# Patient Record
Sex: Male | Born: 2000 | Race: White | Hispanic: No | Marital: Single
Health system: Southern US, Community
[De-identification: ages and names within clinical notes are randomized; demographics above are authoritative.]

---
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
## 2021-05-20 ENCOUNTER — Other Ambulatory Visit: Payer: Self-pay

## 2021-05-20 ENCOUNTER — Ambulatory Visit (INDEPENDENT_AMBULATORY_CARE_PROVIDER_SITE_OTHER): Payer: Self-pay | Admitting: Family Medicine

## 2021-05-20 ENCOUNTER — Encounter (HOSPITAL_BASED_OUTPATIENT_CLINIC_OR_DEPARTMENT_OTHER): Payer: Self-pay | Admitting: Family Medicine

## 2021-05-20 DIAGNOSIS — Z7689 Persons encountering health services in other specified circumstances: Secondary | ICD-10-CM | POA: Insufficient documentation

## 2021-05-20 NOTE — Patient Instructions (Signed)
?  Medication Instructions:  ?Your physician recommends that you continue on your current medications as directed. Please refer to the Current Medication list given to you today. ?--If you need a refill on any your medications before your next appointment, please call your pharmacy first. If no refills are authorized on file call the office.-- ?ange your treatment, we will call you to review the results. ? ? ?Follow-Up: ?Your next appointment:   ?Your physician recommends that you schedule a follow-up appointment in: This week with completed form with Dr. de [place] ? ?You will receive a text message or e-mail with a link to a survey about your care and experience with [place] today! We would greatly appreciate your feedback!  ? ?Thanks for letting us be apart of your health journey!!  ?Primary Care and Sports Medicine  ? ?Dr. Marcy Salvo de [place]  ? ?We encourage you to activate your patient portal called "MyChart".  Sign up information is provided on this After Visit Summary.  MyChart is used to connect with patients for Virtual Visits (Telemedicine).  Patients are able to view lab/test results, encounter notes, upcoming appointments, etc.  Non-urgent messages can be sent to your provider as well. To learn more about what you can do with MyChart, please visit --  [email or web address].   ? ?
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
## 2021-05-21 ENCOUNTER — Ambulatory Visit (INDEPENDENT_AMBULATORY_CARE_PROVIDER_SITE_OTHER): Payer: Self-pay | Admitting: Family Medicine

## 2021-05-21 DIAGNOSIS — Z025 Encounter for examination for participation in sport: Secondary | ICD-10-CM
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
## 2023-10-05 ENCOUNTER — Telehealth: Payer: Self-pay

## 2023-10-05 NOTE — Telephone Encounter (Signed)
 Copied from CRM [id number]. Topic: Clinical - Lab/Test Results >> Oct 05, 2023  9:43 AM Frederich PARAS wrote: Reason for CRM: pt calling in he wants to see if the regular MRI can be switched to a Brain MRI . PT has an apt for 9/9,pt needs the apt before 9/9 its urgent  . If this can't be done can you adv the price for it to be done out of pocket if it can't be switched. Please reach out to PT to let him know if this can be done. Call back # is [phone]

## 2023-10-06 ENCOUNTER — Telehealth (HOSPITAL_BASED_OUTPATIENT_CLINIC_OR_DEPARTMENT_OTHER): Payer: Self-pay | Admitting: *Deleted

## 2023-10-06 NOTE — Telephone Encounter (Signed)
 Looking at pt's chart, seems like same message was sent to a different primary care office too.

## 2023-10-06 NOTE — Telephone Encounter (Signed)
 Routing to Dr Tommi Rumps [place] for review.

## 2023-10-06 NOTE — Telephone Encounter (Signed)
 Copied from CRM [id number]. Topic: Clinical - Lab/Test Results >> Oct 05, 2023  9:43 AM Frederich PARAS wrote: Reason for CRM: pt calling in he wants to see if the regular MRI can be switched to a Brain MRI . PT has an apt for 9/9,pt needs the apt before 9/9 its urgent  . If this can't be done can you adv the price for it to be done out of pocket if it can't be switched. Please reach out to PT to let him know if this can be done. Call back # is [phone]

## 2023-10-12 ENCOUNTER — Telehealth (HOSPITAL_BASED_OUTPATIENT_CLINIC_OR_DEPARTMENT_OTHER): Payer: Self-pay | Admitting: Family Medicine

## 2023-10-12 NOTE — Telephone Encounter (Unsigned)
 Copied from CRM [id number]. Topic: Clinical - Request for Lab/Test Order >> Oct 12, 2023 10:01 AM Gennette ORN wrote: Reason for CRM: Patient is calling in requesting EKG before Sept. 13, 2025 and brain MRI as well he hasn't heard back. He has regular MRI 11/16/23 on but didn't realize it was suppose to be a brain one. [phone]

## 2023-10-13 ENCOUNTER — Telehealth (HOSPITAL_BASED_OUTPATIENT_CLINIC_OR_DEPARTMENT_OTHER): Payer: Self-pay | Admitting: *Deleted

## 2023-10-13 NOTE — Telephone Encounter (Signed)
 noted

## 2023-10-13 NOTE — Telephone Encounter (Signed)
 Spoke with patient he is needing MRI ordered for paperwork he needs. He has an appt scheduled 9/9 advised to keep appt and discuss with provider

## 2023-10-13 NOTE — Telephone Encounter (Signed)
 Copied from CRM #[id number]. Topic: Appointments - Transfer of Care >> Oct 13, 2023  2:53 PM Henretta I wrote: Pt is requesting to transfer FROM: Dr.Raymond de [place]  Pt is requesting to transfer TO: Dr. Lynwood Crandall  Reason for requested transfer: Patient needs regular pcp and not sports medicine Doctor. It is the responsibility of the team the patient would like to transfer to (Dr. Lynwood Crandall) to reach out to the patient if for any reason this transfer is not acceptable.

## 2023-10-15 ENCOUNTER — Encounter: Payer: Self-pay | Admitting: Nurse Practitioner

## 2023-10-15 ENCOUNTER — Ambulatory Visit: Attending: Nurse Practitioner

## 2023-10-15 ENCOUNTER — Ambulatory Visit (INDEPENDENT_AMBULATORY_CARE_PROVIDER_SITE_OTHER): Admitting: Nurse Practitioner

## 2023-10-15 VITALS — BP 110/72 | HR 53 | Temp 97.7°F | Ht 71.75 in | Wt 171.2 lb

## 2023-10-15 DIAGNOSIS — R001 Bradycardia, unspecified: Secondary | ICD-10-CM | POA: Diagnosis not present

## 2023-10-15 DIAGNOSIS — Z0001 Encounter for general adult medical examination with abnormal findings: Secondary | ICD-10-CM

## 2023-10-15 DIAGNOSIS — R42 Dizziness and giddiness: Secondary | ICD-10-CM | POA: Diagnosis not present

## 2023-10-15 DIAGNOSIS — Z0000 Encounter for general adult medical examination without abnormal findings: Secondary | ICD-10-CM

## 2023-10-15 DIAGNOSIS — Z13220 Encounter for screening for lipoid disorders: Secondary | ICD-10-CM

## 2023-10-15 LAB — CBC
HCT: 43 % (ref 39.0–52.0)
Hemoglobin: 14.3 g/dL (ref 13.0–17.0)
MCHC: 33.3 g/dL (ref 30.0–36.0)
MCV: 92.4 fl (ref 78.0–100.0)
Platelets: 169 K/uL (ref 150.0–400.0)
RBC: 4.65 Mil/uL (ref 4.22–5.81)
RDW: 12.3 % (ref 11.5–15.5)
WBC: 5.4 K/uL (ref 4.0–10.5)

## 2023-10-15 LAB — COMPREHENSIVE METABOLIC PANEL WITH GFR
ALT: 28 U/L (ref 0–53)
AST: 42 U/L — ABNORMAL HIGH (ref 0–37)
Albumin: 4.9 g/dL (ref 3.5–5.2)
Alkaline Phosphatase: 74 U/L (ref 39–117)
BUN: 14 mg/dL (ref 6–23)
CO2: 31 meq/L (ref 19–32)
Calcium: 9.5 mg/dL (ref 8.4–10.5)
Chloride: 100 meq/L (ref 96–112)
Creatinine, Ser: 0.97 mg/dL (ref 0.40–1.50)
GFR: 110.82 mL/min (ref >60.00)
Glucose, Bld: 88 mg/dL (ref 70–99)
Potassium: 4.1 meq/L (ref 3.5–5.1)
Sodium: 138 meq/L (ref 135–145)
Total Bilirubin: 0.6 mg/dL (ref 0.2–1.2)
Total Protein: 7.4 g/dL (ref 6.0–8.3)

## 2023-10-15 LAB — LIPID PANEL
Cholesterol: 154 mg/dL (ref 0–200)
HDL: 53.3 mg/dL (ref >39.00)
LDL Cholesterol: 93 mg/dL (ref 0–99)
NonHDL: 100.79
Total CHOL/HDL Ratio: 3
Triglycerides: 39 mg/dL (ref 0.0–149.0)
VLDL: 7.8 mg/dL (ref 0.0–40.0)

## 2023-10-15 LAB — TSH: TSH: 1.07 u[IU]/mL (ref 0.35–5.50)
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
## 2023-10-15 NOTE — Assessment & Plan Note (Signed)
 Discussed age-appropriate immunization and screening exams.  Did review patient's personal, surgical, social, family histories.  Patient up-to-date on all age-appropriate vaccinations he would like.  Patient too young for CRC screening or prostate cancer screening.  Patient declined STI screening today.  Patient was given information at discharge about preventative healthcare maintenance with anticipatory guidance

## 2023-10-15 NOTE — Assessment & Plan Note (Signed)
 First AV block with bradycardia.  At home heart monitor for 2 weeks ambulatory referral to cardiology.

## 2023-10-15 NOTE — Patient Instructions (Signed)
 Nice to see you today I will be in touch with the labs once I have them  Find out about the MRI and call me.  Follow up with me in 1 year, sooner if you need me

## 2023-10-15 NOTE — Assessment & Plan Note (Signed)
 Ambiguous in nature.  Patient is bradycardic with a first-degree AV block.  Will check basic labs inclusive of CBC, CMP, TSH.  Also do an at home heart monitor to make sure patient is experiencing high degree AV block

## 2023-10-19 ENCOUNTER — Ambulatory Visit: Payer: Self-pay | Admitting: Nurse Practitioner

## 2023-10-27 ENCOUNTER — Ambulatory Visit (HOSPITAL_BASED_OUTPATIENT_CLINIC_OR_DEPARTMENT_OTHER): Admitting: Family Medicine

## 2023-10-27 ENCOUNTER — Encounter: Payer: Self-pay | Admitting: *Deleted

## 2023-11-16 ENCOUNTER — Ambulatory Visit (HOSPITAL_BASED_OUTPATIENT_CLINIC_OR_DEPARTMENT_OTHER): Admitting: Family Medicine

## 2023-11-24 ENCOUNTER — Other Ambulatory Visit: Payer: Self-pay | Admitting: Internal Medicine

## 2023-11-24 DIAGNOSIS — R519 Headache, unspecified: Secondary | ICD-10-CM

## 2023-11-28 ENCOUNTER — Other Ambulatory Visit: Payer: Self-pay | Admitting: Internal Medicine

## 2023-11-28 ENCOUNTER — Ambulatory Visit
Admission: RE | Admit: 2023-11-28 | Discharge: 2023-11-28 | Disposition: A | Discharge status: Home / Self Care | Source: Ambulatory Visit | Attending: Internal Medicine | Admitting: Internal Medicine

## 2023-11-28 DIAGNOSIS — R519 Headache, unspecified: Secondary | ICD-10-CM | POA: Insufficient documentation
